# Patient Record
Sex: Male | Born: 1955 | Race: White | Hispanic: No | Marital: Married | Smoking: Never smoker
Health system: Southern US, Community
[De-identification: ages and names within clinical notes are randomized; demographics above are authoritative.]

## PROBLEM LIST (undated history)

## (undated) DIAGNOSIS — R251 Tremor, unspecified: Secondary | ICD-10-CM

## (undated) HISTORY — PX: HAND SURGERY: SHX662

## (undated) HISTORY — PX: HERNIA REPAIR: SHX51

---

## 2010-12-12 ENCOUNTER — Emergency Department (HOSPITAL_COMMUNITY): Payer: BC Managed Care – PPO

## 2010-12-12 ENCOUNTER — Emergency Department (HOSPITAL_COMMUNITY)
Admission: EM | Admit: 2010-12-12 | Discharge: 2010-12-12 | Disposition: A | Discharge status: Home / Self Care | Payer: BC Managed Care – PPO | Attending: Emergency Medicine | Admitting: Emergency Medicine

## 2010-12-12 DIAGNOSIS — S61411A Laceration without foreign body of right hand, initial encounter: Secondary | ICD-10-CM

## 2010-12-12 DIAGNOSIS — S61409A Unspecified open wound of unspecified hand, initial encounter: Secondary | ICD-10-CM | POA: Insufficient documentation

## 2010-12-12 DIAGNOSIS — Y9289 Other specified places as the place of occurrence of the external cause: Secondary | ICD-10-CM | POA: Insufficient documentation

## 2010-12-12 DIAGNOSIS — W268XXA Contact with other sharp object(s), not elsewhere classified, initial encounter: Secondary | ICD-10-CM | POA: Insufficient documentation

## 2010-12-12 MED ORDER — IBUPROFEN 800 MG PO TABS
800.0000 mg | ORAL_TABLET | Freq: Once | ORAL | Status: AC
  Administered 2010-12-12: 800 mg via ORAL
  Filled 2010-12-12: qty 1

## 2010-12-12 MED ORDER — CEPHALEXIN 500 MG PO CAPS: 500.0000 mg | ORAL_CAPSULE | Freq: Four times a day (QID) | ORAL | Status: AC

## 2010-12-12 MED ORDER — HYDROCODONE-ACETAMINOPHEN 5-325 MG PO TABS: ORAL_TABLET | ORAL | Status: DC

## 2010-12-12 MED ORDER — CEPHALEXIN 500 MG PO CAPS
500.0000 mg | ORAL_CAPSULE | Freq: Once | ORAL | Status: AC
  Administered 2010-12-12: 500 mg via ORAL

## 2010-12-12 MED ORDER — LIDOCAINE HCL (PF) 1 % IJ SOLN
INTRAMUSCULAR | Status: AC
  Administered 2010-12-12: 19:00:00
  Filled 2010-12-12: qty 5

## 2010-12-12 MED ORDER — BACITRACIN ZINC 500 UNIT/GM EX OINT
TOPICAL_OINTMENT | CUTANEOUS | Status: AC
  Administered 2010-12-12: 19:00:00
  Filled 2010-12-12: qty 0.9

## 2010-12-12 MED ORDER — CEPHALEXIN 500 MG PO CAPS
ORAL_CAPSULE | ORAL | Status: AC
  Administered 2010-12-12: 500 mg
  Filled 2010-12-12: qty 1

## 2010-12-12 MED ORDER — LIDOCAINE HCL (PF) 2 % IJ SOLN
INTRAMUSCULAR | Status: AC
  Administered 2010-12-12: 19:00:00
  Filled 2010-12-12: qty 10

## 2010-12-12 MED ORDER — OXYCODONE-ACETAMINOPHEN 5-325 MG PO TABS
1.0000 | ORAL_TABLET | Freq: Once | ORAL | Status: AC
  Administered 2010-12-12: 1 via ORAL
  Filled 2010-12-12: qty 1

## 2010-12-12 NOTE — ED Notes (Signed)
Pt presents with right hand laceration. Bleeding is controlled at this time.

## 2010-12-12 NOTE — ED Provider Notes (Signed)
History     CSN: [id number] Arrival date & time: 12/12/2010  4:51 PM   First MD Initiated Contact with Patient 12/12/10 1718      Chief Complaint  Patient presents with  . Laceration    (Consider location/radiation/quality/duration/timing/severity/associated sxs/prior treatment) HPI Comments: Pt is building  A barn.  He was falling off a stool and grabbed a rough cut ceiling joist and lacerated his R hand.  + L hand dominant.  Patient is a 55 y.o. male presenting with skin laceration. No language interpreter was used.  Laceration  The incident occurred 1 to 2 hours ago. The laceration is located on the right hand. The pain has been intermittent since onset. His tetanus status is UTD.    History reviewed. No pertinent past medical history.  Past Surgical History  Procedure Date  . Hernia repair   . Hand surgery     No family history on file.  History  Substance Use Topics  . Smoking status: Never Smoker   . Smokeless tobacco: Not on file  . Alcohol Use: No      Review of Systems  Skin: Positive for wound.  All other systems reviewed and are negative.    Allergies  Review of patient's allergies indicates no known allergies.  Home Medications   Current Outpatient Rx  Name Route Sig Dispense Refill  . ZOLPIDEM TARTRATE 10 MG PO TABS Oral Take 3.25 mg by mouth at bedtime.        BP 133/87  Pulse 104  Temp(Src) 98.4 F (36.9 C) (Oral)  Resp 18  Ht 5\' 10"  (1.778 m)  Wt 165 lb (74.844 kg)  BMI 23.68 kg/m2  SpO2 98%  Physical Exam  Nursing note and vitals reviewed. Constitutional: He is oriented to person, place, and time. He appears well-developed and well-nourished. No distress.  HENT:  Head: Normocephalic and atraumatic.  Eyes: EOM are normal.  Neck: Normal range of motion.  Cardiovascular: Normal rate, regular rhythm, normal heart sounds and intact distal pulses.   Pulmonary/Chest: Effort normal and breath sounds normal. No respiratory distress.    Abdominal: Soft. He exhibits no distension. There is no tenderness.  Musculoskeletal: He exhibits tenderness.       Right hand: He exhibits decreased range of motion, tenderness, bony tenderness and laceration. He exhibits normal two-point discrimination, normal capillary refill and no deformity. normal sensation noted.       Hands: Neurological: He is alert and oriented to person, place, and time.  Skin: Skin is warm and dry. He is not diaphoretic.  Psychiatric: He has a normal mood and affect. Judgment normal.    ED Course  LACERATION REPAIR Date/Time: 12/12/2010 6:25 PM Performed by: Worthy Rancher Authorized by: Worthy Rancher Consent: Verbal consent obtained. Written consent not obtained. Risks and benefits: risks, benefits and alternatives were discussed Consent given by: patient Patient understanding: patient states understanding of the procedure being performed Required items: required blood products, implants, devices, and special equipment available Patient identity confirmed: verbally with patient Time out: Immediately prior to procedure a "time out" was called to verify the correct patient, procedure, equipment, support staff and site/side marked as required. Body area: upper extremity Location details: right hand Laceration length: 3 cm Foreign bodies: no foreign bodies Tendon involvement: none Nerve involvement: none Vascular damage: no Anesthesia: digital block Local anesthetic: lidocaine 2% without epinephrine Anesthetic total: 8 ml Patient sedated: no Preparation: Patient was prepped and draped in the usual sterile fashion. Irrigation solution: saline  Irrigation method: syringe Amount of cleaning: extensive Debridement: minimal Degree of undermining: none Skin closure: 4-0 nylon Number of sutures: 4 Patient tolerance: Patient tolerated the procedure well with no immediate complications.   (including critical care time)  Labs Reviewed - No data to  display No results found.   No diagnosis found.    MDM          Worthy Rancher, PA 12/12/10 1902

## 2010-12-13 NOTE — ED Provider Notes (Signed)
Medical screening examination/treatment/procedure(s) were performed by non-physician practitioner and as supervising physician I was immediately available for consultation/collaboration. Devoria Albe, MD, [facility]   Ward Givens, MD 12/13/10 [phone]

## 2012-04-15 IMAGING — CR DG HAND COMPLETE 3+V*R*
3 series · 3 of 3 positions shown · non-contrast
Comparison: None.

CLINICAL DATA: Trauma/fall, laceration between the thumb and index
finger, prior tendon injury to fifth digit

RIGHT HAND - COMPLETE 3+ VIEW

[view not recorded (1 of 3)]
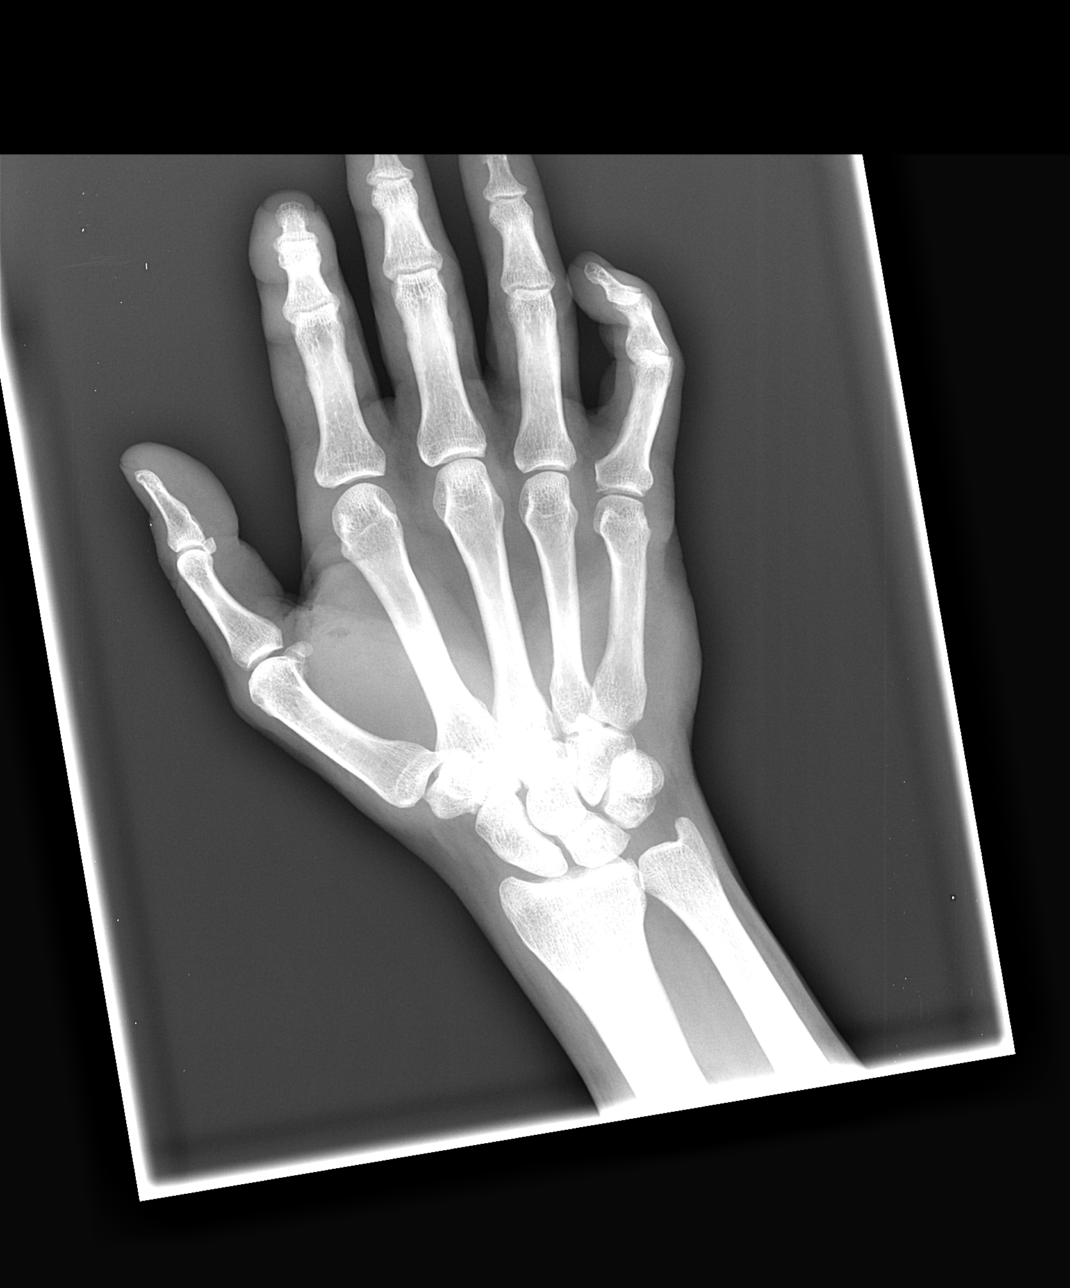

[view not recorded (2 of 3)]
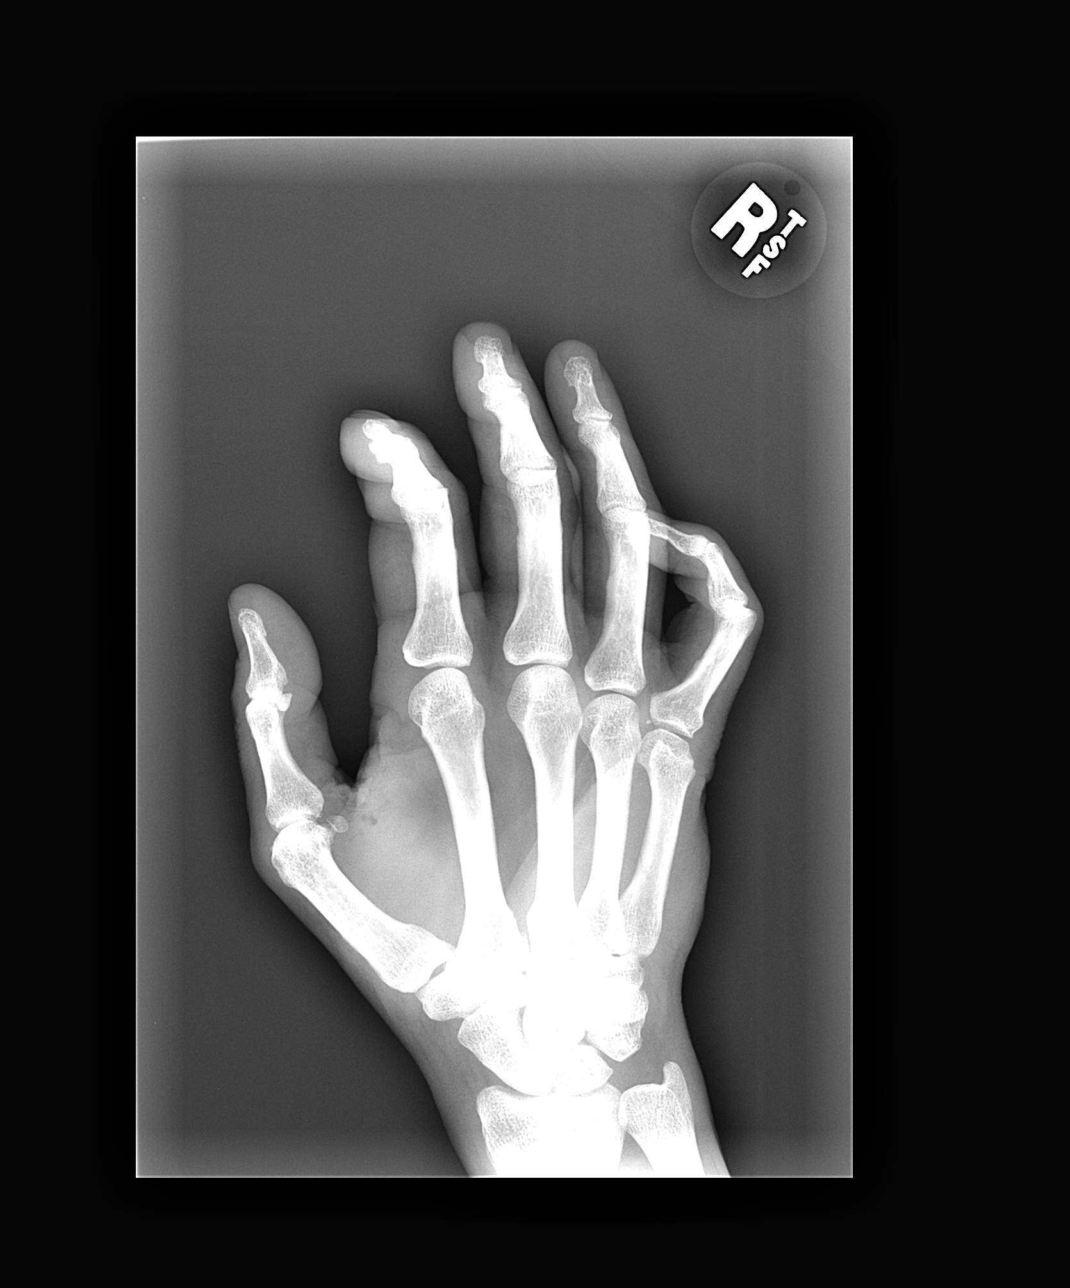

[view not recorded (3 of 3)]
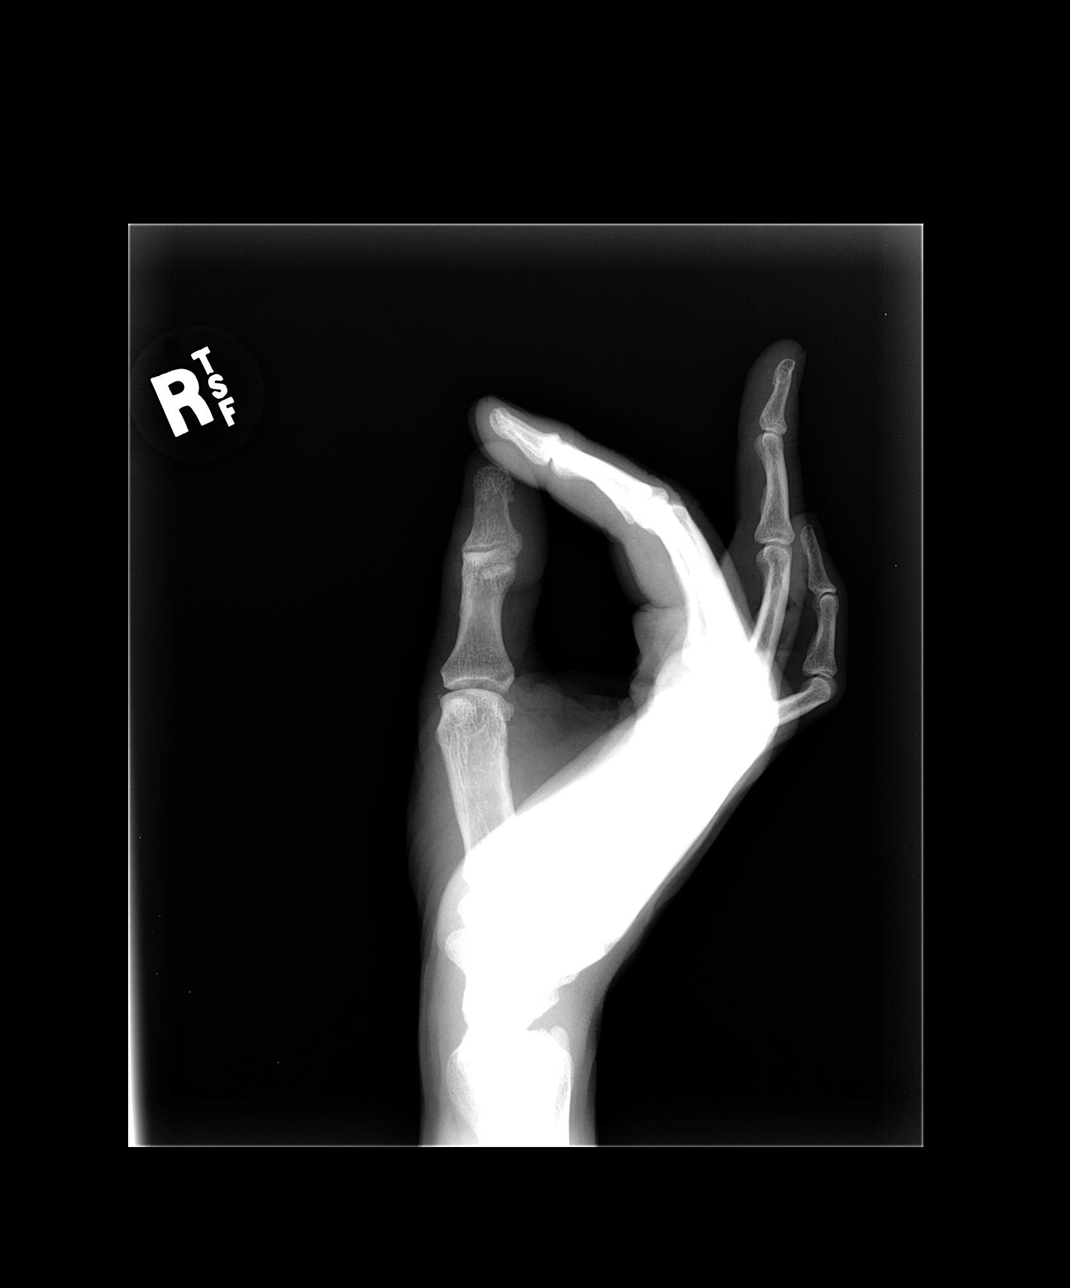

[3 of 3 positions shown; findings below may reference images not displayed]

FINDINGS: No fracture or dislocation is seen.

The joint spaces are preserved.

Soft tissue injury/laceration involving the left first/second digit
interspace.  No radiopaque foreign body is seen.
IMPRESSION: No fracture, dislocation, or radiopaque foreign body is seen.

Soft tissue injury/laceration involving the left first/second digit
interspace.

## 2016-06-30 ENCOUNTER — Encounter: Payer: Self-pay | Admitting: Orthopedic Surgery

## 2016-06-30 ENCOUNTER — Ambulatory Visit (INDEPENDENT_AMBULATORY_CARE_PROVIDER_SITE_OTHER): Payer: 59 | Admitting: Orthopedic Surgery

## 2016-06-30 VITALS — BP 122/70 | HR 81 | Ht 70.0 in | Wt 161.0 lb

## 2016-06-30 DIAGNOSIS — G5602 Carpal tunnel syndrome, left upper limb: Secondary | ICD-10-CM | POA: Diagnosis not present

## 2016-06-30 NOTE — Progress Notes (Signed)
  NEW PATIENT OFFICE VISIT    Chief Complaint  Patient presents with  . Carpal Tunnel    LT    61 year old male retired from the [employer] began having numbness and tingling in his left hand for years ago. It involved his thumb index and long finger and was associated with driving and was intermittent. 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 He is left-hand dominant  Bringing us forward in July he started having tingling 100% of the time in the thumb index and long finger was worse when he is writing doesn't hurt him or bother many other time he has not had any medicine or bracing    Review of Systems  Constitutional: Negative for chills and fever.  Respiratory: Negative for shortness of breath.   Cardiovascular: Negative for chest pain.  Neurological: Positive for tingling. Negative for focal weakness.    The patient has no hypertension diabetes or cardiac disease   Past Surgical History:  Procedure Laterality Date  . HAND SURGERY    . HERNIA REPAIR      No family history on file. Social History  Substance Use Topics  . Smoking status: Never Smoker  . Smokeless tobacco: Never Used  . Alcohol use No    BP 122/70   Pulse 81   Ht 5\' 10"  (1.778 m)   Wt 161 lb (73 kg)   BMI 23.10 kg/m   Physical Exam  Constitutional: He appears well-developed and well-nourished.  Vital signs have been reviewed and are stable. Gen. appearance the patient is well-developed and well-nourished with normal grooming and hygiene. The patient is oriented 3 with normal mood and affect.  Vitals reviewed.   Ortho Exam On the left side we find tenderness over the carpal tunnel with a positive compression test stability and range of motion was normal motor was normal pulses normal sensation was altered in the thumb index and long finger with ring and small finger normal skin was intact  On the right side he had normal range of  motion stability strength sensation pulse skin No orders of the defined types were placed in this encounter.   Encounter Diagnosis  Name Primary?  . Left carpal tunnel syndrome Yes     PLAN:   Patient will have vitamin B 600 mg twice a day for 6 weeks night splint for 6 weeks come back in 6 weeks

## 2016-06-30 NOTE — Patient Instructions (Addendum)
Take vitamin B6 twice a day for 6 weeks   Carpal Tunnel Syndrome Carpal tunnel syndrome is a condition that causes pain in your hand and arm. The carpal tunnel is a narrow area located on the palm side of your wrist. Repeated wrist motion or certain diseases may cause swelling within the tunnel. This swelling pinches the main nerve in the wrist (median nerve). What are the causes? This condition may be caused by:  Repeated wrist motions.  Wrist injuries.  Arthritis.  A cyst or tumor in the carpal tunnel.  Fluid buildup during pregnancy.  Sometimes the cause of this condition is not known. What increases the risk? This condition is more likely to develop in:  People who have jobs that cause them to repeatedly move their wrists in the same motion, such as [employer] visitor[employer] and cashiers.  Women.  People with certain conditions, such as: ? Diabetes. ? Obesity. ? An underactive thyroid (hypothyroidism). ? Kidney failure.  What are the signs or symptoms? Symptoms of this condition include:  A tingling feeling in your fingers, especially in your thumb, index, and middle fingers.  Tingling or numbness in your hand.  An aching feeling in your entire arm, especially when your wrist and elbow are bent for long periods of time.  Wrist pain that goes up your arm to your shoulder.  Pain that goes down into your palm or fingers.  A weak feeling in your hands. You may have trouble grabbing and holding items.  Your symptoms may feel worse during the night. How is this diagnosed? This condition is diagnosed with a medical history and physical exam. You may also have tests, including:  An electromyogram (EMG). This test measures electrical signals sent by your nerves into the muscles.  X-rays.  How is this treated? Treatment for this condition includes:  Lifestyle changes. It is important to stop doing or modify the activity that caused your condition.  Physical or occupational  therapy.  Medicines for pain and inflammation. This may include medicine that is injected into your wrist.  A wrist splint.  Surgery.  Follow these instructions at home: If you have a splint:  Wear it as told by your health care provider. Remove it only as told by your health care provider.  Loosen the splint if your fingers become numb and tingle, or if they turn cold and blue.  Keep the splint clean and dry. General instructions  Take over-the-counter and prescription medicines only as told by your health care provider.  Rest your wrist from any activity that may be causing your pain. If your condition is work related, talk to your employer about changes that can be made, such as getting a wrist pad to use while typing.  If directed, apply ice to the painful area: ? Put ice in a plastic bag. ? Place a towel between your skin and the bag. ? Leave the ice on for 20 minutes, 2-3 times per day.  Keep all follow-up visits as told by your health care provider. This is important.  Do any exercises as told by your health care provider, physical therapist, or occupational therapist. Contact a health care provider if:  You have new symptoms.  Your pain is not controlled with medicines.  Your symptoms get worse. This information is not intended to replace advice given to you by your health care provider. Make sure you discuss any questions you have with your health care provider. Document Released: 01/10/2000 Document Revised: 05/23/2015 Document Reviewed: 05/30/2014  Elsevier Interactive Patient Education  2017 [employer]  

## 2016-08-11 ENCOUNTER — Encounter: Payer: Self-pay | Admitting: Orthopedic Surgery

## 2016-08-11 ENCOUNTER — Ambulatory Visit (INDEPENDENT_AMBULATORY_CARE_PROVIDER_SITE_OTHER): Payer: 59 | Admitting: Orthopedic Surgery

## 2016-08-11 DIAGNOSIS — G5602 Carpal tunnel syndrome, left upper limb: Secondary | ICD-10-CM

## 2016-08-11 NOTE — Progress Notes (Signed)
FOLLOW UP APPT  Chief Complaint  Patient presents with  . Follow-up    Recheck on left CTS.   61 year old male with long-standing left carpal tunnel syndrome has not improved with medication. He is interesting have a left carpal tunnel release  We reviewed the surgical procedure, and postoperative potential complications. The patient will call us back with the date that is convenient for him to have the surgery.  Review of Systems  Constitutional: Negative for chills and fever.  Respiratory: Negative for shortness of breath.   Cardiovascular: Negative for chest pain.  Neurological: Positive for tingling. Negative for focal weakness.     History reviewed. No pertinent past medical history. Past Surgical History:  Procedure Laterality Date  . HAND SURGERY    . HERNIA REPAIR     Social History  Substance Use Topics  . Smoking status: Never Smoker  . Smokeless tobacco: Never Used  . Alcohol use No   History reviewed. No pertinent family history.   Encounter Diagnoses  Name Primary?  . Left carpal tunnel syndrome Yes    OPEN LEFT CARPAL TUNNEL RELEASE

## 2016-08-18 ENCOUNTER — Telehealth: Payer: Self-pay | Admitting: Orthopedic Surgery

## 2016-08-18 NOTE — Telephone Encounter (Signed)
Mr. Dustin Watson called this morning with some dates for open left carpal tunnel surgery.  He said he would like to have surgery sometime the week of September 10th if possible.    Please let him know if this is possible  Thanks

## 2016-08-19 ENCOUNTER — Other Ambulatory Visit: Payer: Self-pay | Admitting: *Deleted

## 2016-08-19 NOTE — Telephone Encounter (Signed)
Surgery scheduled for 10/08/16

## 2016-08-31 ENCOUNTER — Telehealth: Payer: Self-pay | Admitting: Orthopedic Surgery

## 2016-08-31 NOTE — Telephone Encounter (Addendum)
Patient called this morning stating he had a couple of questions:  He wants to know how much the surgery was going to cost.  He wants to know if he can move the surgery out a week, as in September 16th.  Please give him a call.  Thanks    I SPOKE WITH  Dustin Watson AND THE 18TH WORKS FOR HIM FOR SURGERY.

## 2016-08-31 NOTE — Telephone Encounter (Signed)
I HAVE NO WAY OF KNOWING COST  AND WE HAVE SEPT 18TH AVAILABLE

## 2016-09-01 NOTE — Telephone Encounter (Signed)
PLEASE GO AHEAD AND SCHEDULE HIM FOR THE 18TH.  I HAVE CHANGED POST OP APPOINTMENT HERE IN THE OFFICE

## 2016-09-29 ENCOUNTER — Telehealth: Payer: Self-pay | Admitting: Orthopedic Surgery

## 2016-09-29 NOTE — Telephone Encounter (Signed)
Surgery has been moved to the 18th as requested. Call patient, no answer, left detailed vm.

## 2016-09-29 NOTE — Telephone Encounter (Signed)
Patient called to re-verify date of surgery - [facility] Left Carpal tunnel release, Date of surgery as 10/08/16.  Most recent notes indicate Date of surgery updated as discussed: 10/13/16.  Please advise. Patient [phone]#[phone].

## 2016-09-29 NOTE — Telephone Encounter (Signed)
Routing to [employer]he

## 2016-09-30 NOTE — Patient Instructions (Signed)
Dustin Watson  09/30/2016     @PREFPERIOPPHARMACY @   Your procedure is scheduled on  10/13/2016   Report to [facility] at  845  A.M.  Call this number if you have problems the morning of surgery:  [phone]   Remember:  Do not eat food or drink liquids after midnight.  Take these medicines the morning of surgery with A SIP OF WATER  Primidone.   Do not wear jewelry, make-up or nail polish.  Do not wear lotions, powders, or perfumes, or deoderant.  Do not shave 48 hours prior to surgery.  Men may shave face and neck.  Do not bring valuables to the hospital.  [facility] is not responsible for any belongings or valuables.  Contacts, dentures or bridgework may not be worn into surgery.  Leave your suitcase in the car.  After surgery it may be brought to your room.  For patients admitted to the hospital, discharge time will be determined by your treatment team.  Patients discharged the day of surgery will not be allowed to drive home.   Name and phone number of your driver:   Family Special instructions:  None  Please read over the following fact sheets that you were given. Anesthesia Post-op Instructions and Care and Recovery After Surgery       Carpal Tunnel Release Carpal tunnel release is a surgical procedure to relieve numbness and pain in your hand that are caused by carpal tunnel syndrome. Your carpal tunnel is a narrow, hollow space in your wrist. It passes between your wrist bones and a band of connective tissue (transverse carpal ligament). The nerve that supplies most of your hand (median nerve) passes through this space, and so do the connections between your fingers and the muscles of your arm (tendons). Carpal tunnel syndrome makes this space swell and become narrow, and this causes pain and numbness. In carpal tunnel release surgery, a surgeon cuts through the transverse carpal ligament to make more room in the carpal tunnel space. You may have  this surgery if other types of treatment have not worked. Tell a health care provider about:  Any allergies you have.  All medicines you are taking, including vitamins, herbs, eye drops, creams, and over-the-counter medicines.  Any problems you or family members have had with anesthetic medicines.  Any blood disorders you have.  Any surgeries you have had.  Any medical conditions you have. What are the risks? Generally, this is a safe procedure. However, problems may occur, including:  Bleeding.  Infection.  Injury to the median nerve.  Need for additional surgery.  What happens before the procedure?  Ask your health care provider about: ? Changing or stopping your regular medicines. This is especially important if you are taking diabetes medicines or blood thinners. ? Taking medicines such as aspirin and ibuprofen. These medicines can thin your blood. Do not take these medicines before your procedure if your health care provider instructs you not to.  Do not eat or drink anything after midnight on the night before the procedure or as directed by your health care provider.  Plan to have someone take you home after the procedure. What happens during the procedure?  An IV tube may be inserted into a vein.  You will be given one of the following: ? A medicine that numbs the wrist area (local anesthetic). You may also be given a medicine to make you relax (sedative). ?  A medicine that makes you go to sleep (general anesthetic).  Your arm, hand, and wrist will be cleaned with a germ-killing solution (antiseptic).  Your surgeon will make a surgical cut (incision) over the palm side of your wrist. The surgeon will pull aside the skin of your wrist to expose the carpal tunnel space.  The surgeon will cut the transverse carpal ligament.  The edges of the incision will be closed with stitches (sutures) or staples.  A bandage (dressing) will be placed over your wrist and  wrapped around your hand and wrist. What happens after the procedure?  You may spend some time in a recovery area.  Your blood pressure, heart rate, breathing rate, and blood oxygen level will be monitored often until the medicines you were given have worn off.  You will likely have some pain. You will be given pain medicine.  You may need to wear a splint or a wrist brace over your dressing. This information is not intended to replace advice given to you by your health care provider. Make sure you discuss any questions you have with your health care provider. Document Released: 04/04/2003 Document Revised: 06/20/2015 Document Reviewed: 08/30/2013 Elsevier Interactive Patient Education  2017 Else[employer] Inc. Carpal Tunnel Release, Care After Refer to this sheet in the next few weeks. These instructions provide you with information about caring for yourself after your procedure. Your health care provider may also give you more specific instructions. Your treatment has been planned according to current medical practices, but problems sometimes occur. Call your health care provider if you have any problems or questions after your procedure. What can I expect after the procedure? After your procedure, it is typical to have the following:  Pain.  Numbness.  Tingling.  Swelling.  Stiffness.  Bruising.  Follow these instructions at home:  Take medicines only as directed by your health care provider.  There are many different ways to close and cover an incision, including stitches (sutures), skin glue, and adhesive strips. Follow your health care provider's instructions about: ? Incision care. ? Bandage (dressing) changes and removal. ? Incision closure removal.  Wear a splint or a brace as directed by your surgeon. You may need to do this for 2-3 weeks.  Keep your hand raised (elevated) above the level of your heart while you are resting. Move your fingers often.  Avoid activities  that cause hand pain.  Ask your surgeon when you can start to do all of your usual activities again, such as: ? Driving. ? Returning to work. ? Bathing and swimming.  Keep all follow-up visits as directed by your health care provider. This is important. You may need physical therapy for several months to speed healing and regain movement. Contact a health care provider if:  You have drainage, redness, swelling, or pain at your incision site.  You have a fever.  You have chills.  Your pain medicine is not working.  Your symptoms do not go away after 2 months.  Your symptoms go away and then return. Get help right away if:  You have pain or numbness that is getting worse.  Your fingers change color.  You are not able to move your fingers. This information is not intended to replace advice given to you by your health care provider. Make sure you discuss any questions you have with your health care provider. Document Released: 08/01/2004 Document Revised: 06/20/2015 Document Reviewed: 08/30/2013 Elsevier Interactive Patient Education  2017 Elsevier Inc.  Monitored Anesthesia  Care Anesthesia is a term that refers to techniques, procedures, and medicines that help a person stay safe and comfortable during a medical procedure. Monitored anesthesia care, or sedation, is one type of anesthesia. Your anesthesia specialist may recommend sedation if you will be having a procedure that does not require you to be unconscious, such as:  Cataract surgery.  A dental procedure.  A biopsy.  A colonoscopy.  During the procedure, you may receive a medicine to help you relax (sedative). There are three levels of sedation:  Mild sedation. At this level, you may feel awake and relaxed. You will be able to follow directions.  Moderate sedation. At this level, you will be sleepy. You may not remember the procedure.  Deep sedation. At this level, you will be asleep. You will not remember the  procedure.  The more medicine you are given, the deeper your level of sedation will be. Depending on how you respond to the procedure, the anesthesia specialist may change your level of sedation or the type of anesthesia to fit your needs. An anesthesia specialist will monitor you closely during the procedure. Let your health care provider know about:  Any allergies you have.  All medicines you are taking, including vitamins, herbs, eye drops, creams, and over-the-counter medicines.  Any use of steroids (by mouth or as a cream).  Any problems you or family members have had with sedatives and anesthetic medicines.  Any blood disorders you have.  Any surgeries you have had.  Any medical conditions you have, such as sleep apnea.  Whether you are pregnant or may be pregnant.  Any use of cigarettes, alcohol, or street drugs. What are the risks? Generally, this is a safe procedure. However, problems may occur, including:  Getting too much medicine (oversedation).  Nausea.  Allergic reaction to medicines.  Trouble breathing. If this happens, a breathing tube may be used to help with breathing. It will be removed when you are awake and breathing on your own.  Heart trouble.  Lung trouble.  Before the procedure Staying hydrated Follow instructions from your health care provider about hydration, which may include:  Up to 2 hours before the procedure - you may continue to drink clear liquids, such as water, clear fruit juice, black coffee, and plain tea.  Eating and drinking restrictions Follow instructions from your health care provider about eating and drinking, which may include:  8 hours before the procedure - stop eating heavy meals or foods such as meat, fried foods, or fatty foods.  6 hours before the procedure - stop eating light meals or foods, such as toast or cereal.  6 hours before the procedure - stop drinking milk or drinks that contain milk.  2 hours before the  procedure - stop drinking clear liquids.  Medicines Ask your health care provider about:  Changing or stopping your regular medicines. This is especially important if you are taking diabetes medicines or blood thinners.  Taking medicines such as aspirin and ibuprofen. These medicines can thin your blood. Do not take these medicines before your procedure if your health care provider instructs you not to.  Tests and exams  You will have a physical exam.  You may have blood tests done to show: ? How well your kidneys and liver are working. ? How well your blood can clot.  General instructions  Plan to have someone take you home from the hospital or clinic.  If you will be going home right after the procedure,  plan to have someone with you for 24 hours.  What happens during the procedure?  Your blood pressure, heart rate, breathing, level of pain and overall condition will be monitored.  An IV tube will be inserted into one of your veins.  Your anesthesia specialist will give you medicines as needed to keep you comfortable during the procedure. This may mean changing the level of sedation.  The procedure will be performed. After the procedure  Your blood pressure, heart rate, breathing rate, and blood oxygen level will be monitored until the medicines you were given have worn off.  Do not drive for 24 hours if you received a sedative.  You may: ? Feel sleepy, clumsy, or nauseous. ? Feel forgetful about what happened after the procedure. ? Have a sore throat if you had a breathing tube during the procedure. ? Vomit. This information is not intended to replace advice given to you by your health care provider. Make sure you discuss any questions you have with your health care provider. Document Released: 10/08/2004 Document Revised: 06/21/2015 Document Reviewed: 05/05/2015 Elsevier Interactive Patient Education  2018 [place], Care After These  instructions provide you with information about caring for yourself after your procedure. Your health care provider may also give you more specific instructions. Your treatment has been planned according to current medical practices, but problems sometimes occur. Call your health care provider if you have any problems or questions after your procedure. What can I expect after the procedure? After your procedure, it is common to:  Feel sleepy for several hours.  Feel clumsy and have poor balance for several hours.  Feel forgetful about what happened after the procedure.  Have poor judgment for several hours.  Feel nauseous or vomit.  Have a sore throat if you had a breathing tube during the procedure.  Follow these instructions at home: For at least 24 hours after the procedure:   Do not: ? Participate in activities in which you could fall or become injured. ? Drive. ? Use heavy machinery. ? Drink alcohol. ? Take sleeping pills or medicines that cause drowsiness. ? Make important decisions or sign legal documents. ? Take care of children on your own.  Rest. Eating and drinking  Follow the diet that is recommended by your health care provider.  If you vomit, drink water, juice, or soup when you can drink without vomiting.  Make sure you have little or no nausea before eating solid foods. General instructions  Have a responsible adult stay with you until you are awake and alert.  Take over-the-counter and prescription medicines only as told by your health care provider.  If you smoke, do not smoke without supervision.  Keep all follow-up visits as told by your health care provider. This is important. Contact a health care provider if:  You keep feeling nauseous or you keep vomiting.  You feel light-headed.  You develop a rash.  You have a fever. Get help right away if:  You have trouble breathing. This information is not intended to replace advice given to you  by your health care provider. Make sure you discuss any questions you have with your health care provider. Document Released: 05/05/2015 Document Revised: 09/04/2015 Document Reviewed: 05/05/2015 Elsevier Interactive Patient Education  [employer].

## 2016-10-02 ENCOUNTER — Encounter (HOSPITAL_COMMUNITY)
Admission: RE | Admit: 2016-10-02 | Discharge: 2016-10-02 | Disposition: A | Discharge status: Home / Self Care | Payer: 59 | Source: Ambulatory Visit | Attending: Orthopedic Surgery | Admitting: Orthopedic Surgery

## 2016-10-02 ENCOUNTER — Encounter (HOSPITAL_COMMUNITY): Payer: Self-pay

## 2016-10-02 DIAGNOSIS — Z01818 Encounter for other preprocedural examination: Secondary | ICD-10-CM | POA: Insufficient documentation

## 2016-10-02 HISTORY — DX: Tremor, unspecified: R25.1

## 2016-10-02 LAB — CBC WITH DIFFERENTIAL/PLATELET
BASOS ABS: 0 10*3/uL (ref 0.0–0.1)
BASOS PCT: 1 %
EOS ABS: 0.2 10*3/uL (ref 0.0–0.7)
EOS PCT: 3 %
HCT: 45.8 % (ref 39.0–52.0)
Hemoglobin: 15.8 g/dL (ref 13.0–17.0)
Lymphocytes Relative: 25 %
Lymphs Abs: 1.5 10*3/uL (ref 0.7–4.0)
MCH: 31.8 pg (ref 26.0–34.0)
MCHC: 34.5 g/dL (ref 30.0–36.0)
MCV: 92.2 fL (ref 78.0–100.0)
Monocytes Absolute: 0.7 10*3/uL (ref 0.1–1.0)
Monocytes Relative: 11 %
Neutro Abs: 3.8 10*3/uL (ref 1.7–7.7)
Neutrophils Relative %: 62 %
PLATELETS: 195 10*3/uL (ref 150–400)
RBC: 4.97 MIL/uL (ref 4.22–5.81)
RDW: 12.1 % (ref 11.5–15.5)
WBC: 6.1 10*3/uL (ref 4.0–10.5)

## 2016-10-02 LAB — BASIC METABOLIC PANEL
ANION GAP: 8 (ref 5–15)
BUN: 25 mg/dL — ABNORMAL HIGH (ref 6–20)
CALCIUM: 9.1 mg/dL (ref 8.9–10.3)
CO2: 25 mmol/L (ref 22–32)
Chloride: 102 mmol/L (ref 101–111)
Creatinine, Ser: 1.18 mg/dL (ref 0.61–1.24)
GFR calc Af Amer: >60 mL/min (ref >60)
Glucose, Bld: 99 mg/dL (ref 65–99)
POTASSIUM: 3.8 mmol/L (ref 3.5–5.1)
SODIUM: 135 mmol/L (ref 135–145)

## 2016-10-07 ENCOUNTER — Encounter: Payer: Self-pay | Admitting: *Deleted

## 2016-10-12 ENCOUNTER — Ambulatory Visit: Payer: 59 | Admitting: Orthopedic Surgery

## 2016-10-12 NOTE — H&P (Signed)
  Chief Complaint  Patient presents with  . Follow-up      Recheck on left CTS.    61 year old male with long-standing left carpal tunnel syndrome Was treated with medication he wanted to have carpal tunnel release he presents for surgery. He complained of pain paresthesias weakness tightness in his left upper extremity for several years  We reviewed the surgical procedure, and postoperative potential complications. The patient will call us back with the date that is convenient for him to have the surgery.   Review of Systems  Constitutional: Negative for chills and fever.  Respiratory: Negative for shortness of breath.   Cardiovascular: Negative for chest pain.  Neurological: Positive for tingling. Negative for focal weakness.        History reviewed. No pertinent past medical history.      Past Surgical History:  Procedure Laterality Date  . HAND SURGERY      . HERNIA REPAIR            Social History  Substance Use Topics  . Smoking status: Never Smoker  . Smokeless tobacco: Never Used  . Alcohol use No    History reviewed. No pertinent family history.    Physical Exam  Constitutional: He appears well-developed and well-nourished.  Vital signs have been reviewed and are stable. Gen. appearance the patient is well-developed and well-nourished with normal grooming and hygiene. The patient is oriented 3 with normal mood and affect.  Vitals reviewed.     Ortho Exam On the left side we find tenderness over the carpal tunnel with a positive compression test stability and range of motion was normal motor was normal pulses normal sensation was altered in the thumb index and long finger with ring and small finger normal skin was intact   On the right side he had normal range of motion stability strength sensation pulse skin No orders of the defined types were placed in this encounter.          Encounter Diagnoses  Name Primary?  . Left carpal tunnel syndrome Yes       OPEN LEFT CARPAL TUNNEL RELEASE

## 2016-10-13 ENCOUNTER — Ambulatory Visit (HOSPITAL_COMMUNITY)
Admission: RE | Admit: 2016-10-13 | Discharge: 2016-10-13 | Disposition: A | Discharge status: Home / Self Care | Payer: 59 | Source: Ambulatory Visit | Attending: Orthopedic Surgery | Admitting: Orthopedic Surgery

## 2016-10-13 ENCOUNTER — Encounter (HOSPITAL_COMMUNITY): Payer: Self-pay | Admitting: *Deleted

## 2016-10-13 ENCOUNTER — Encounter (HOSPITAL_COMMUNITY)
Admission: RE | Disposition: A | Discharge status: Home / Self Care | Payer: Self-pay | Source: Ambulatory Visit | Attending: Orthopedic Surgery

## 2016-10-13 ENCOUNTER — Ambulatory Visit (HOSPITAL_COMMUNITY): Payer: 59 | Admitting: Anesthesiology

## 2016-10-13 DIAGNOSIS — G5602 Carpal tunnel syndrome, left upper limb: Secondary | ICD-10-CM | POA: Diagnosis not present

## 2016-10-13 HISTORY — PX: CARPAL TUNNEL RELEASE: SHX101

## 2016-10-13 SURGERY — CARPAL TUNNEL RELEASE
Anesthesia: Monitor Anesthesia Care | Laterality: Left

## 2016-10-13 MED ORDER — ACETAMINOPHEN-CODEINE #4 300-60 MG PO TABS: 1.0000 | ORAL_TABLET | ORAL | 1 refills | Status: DC | PRN

## 2016-10-13 MED ORDER — LIDOCAINE HCL (PF) 0.5 % IJ SOLN
INTRAMUSCULAR | Status: AC
  Filled 2016-10-13: qty 50

## 2016-10-13 MED ORDER — MIDAZOLAM HCL 2 MG/2ML IJ SOLN
1.0000 mg | INTRAMUSCULAR | Status: AC
  Administered 2016-10-13: 2 mg via INTRAVENOUS

## 2016-10-13 MED ORDER — PROPOFOL 10 MG/ML IV BOLUS
INTRAVENOUS | Status: AC
  Filled 2016-10-13: qty 20

## 2016-10-13 MED ORDER — LACTATED RINGERS IV SOLN
INTRAVENOUS | Status: DC
  Administered 2016-10-13: 08:00:00 via INTRAVENOUS

## 2016-10-13 MED ORDER — BUPIVACAINE HCL (PF) 0.5 % IJ SOLN
INTRAMUSCULAR | Status: DC | PRN
  Administered 2016-10-13: 10 mL

## 2016-10-13 MED ORDER — 0.9 % SODIUM CHLORIDE (POUR BTL) OPTIME
TOPICAL | Status: DC | PRN
Start: 2016-10-13 — End: 2016-10-13
  Administered 2016-10-13: 1000 mL

## 2016-10-13 MED ORDER — CHLORHEXIDINE GLUCONATE 4 % EX LIQD: 60.0000 mL | Freq: Once | CUTANEOUS | Status: DC

## 2016-10-13 MED ORDER — MIDAZOLAM HCL 2 MG/2ML IJ SOLN
INTRAMUSCULAR | Status: AC
Start: 2016-10-13 — End: ?
  Filled 2016-10-13: qty 2

## 2016-10-13 MED ORDER — PROPOFOL 500 MG/50ML IV EMUL
INTRAVENOUS | Status: DC | PRN
  Administered 2016-10-13: 50 ug/kg/min via INTRAVENOUS

## 2016-10-13 MED ORDER — FENTANYL CITRATE (PF) 100 MCG/2ML IJ SOLN
INTRAMUSCULAR | Status: AC
  Filled 2016-10-13: qty 2

## 2016-10-13 MED ORDER — ACETAMINOPHEN-CODEINE #3 300-30 MG PO TABS: 1.0000 | ORAL_TABLET | ORAL | 1 refills | Status: DC | PRN

## 2016-10-13 MED ORDER — MIDAZOLAM HCL 2 MG/2ML IJ SOLN
INTRAMUSCULAR | Status: AC
  Filled 2016-10-13: qty 2

## 2016-10-13 MED ORDER — FENTANYL CITRATE (PF) 100 MCG/2ML IJ SOLN
25.0000 ug | Freq: Once | INTRAMUSCULAR | Status: AC
  Administered 2016-10-13: 25 ug via INTRAVENOUS

## 2016-10-13 MED ORDER — BUPIVACAINE HCL (PF) 0.5 % IJ SOLN
INTRAMUSCULAR | Status: AC
  Filled 2016-10-13: qty 30

## 2016-10-13 MED ORDER — LIDOCAINE HCL (PF) 1 % IJ SOLN
INTRAMUSCULAR | Status: AC
  Filled 2016-10-13: qty 5

## 2016-10-13 MED ORDER — CEFAZOLIN SODIUM-DEXTROSE 2-4 GM/100ML-% IV SOLN
2.0000 g | INTRAVENOUS | Status: AC
  Administered 2016-10-13: 2 g via INTRAVENOUS
  Filled 2016-10-13: qty 100

## 2016-10-13 MED ORDER — PROPOFOL 10 MG/ML IV BOLUS
INTRAVENOUS | Status: DC | PRN
  Administered 2016-10-13: 10 mg via INTRAVENOUS

## 2016-10-13 MED ORDER — FENTANYL CITRATE (PF) 100 MCG/2ML IJ SOLN: 25.0000 ug | INTRAMUSCULAR | Status: DC | PRN

## 2016-10-13 SURGICAL SUPPLY — 37 items
BAG HAMPER (MISCELLANEOUS) ×2 IMPLANT
BANDAGE ELASTIC 3 LF NS (GAUZE/BANDAGES/DRESSINGS) ×2 IMPLANT
BANDAGE ESMARK 4X12 BL STRL LF (DISPOSABLE) ×1 IMPLANT
BLADE SURG 15 STRL LF DISP TIS (BLADE) ×1 IMPLANT
BLADE SURG 15 STRL SS (BLADE) ×1
BNDG COHESIVE 4X5 TAN STRL (GAUZE/BANDAGES/DRESSINGS) ×2 IMPLANT
BNDG ESMARK 4X12 BLUE STRL LF (DISPOSABLE) ×2
BNDG GAUZE ELAST 4 BULKY (GAUZE/BANDAGES/DRESSINGS) ×2 IMPLANT
CHLORAPREP W/TINT 26ML (MISCELLANEOUS) ×2 IMPLANT
CLOTH BEACON ORANGE TIMEOUT ST (SAFETY) ×2 IMPLANT
COVER LIGHT HANDLE STERIS (MISCELLANEOUS) ×4 IMPLANT
CUFF TOURNIQUET SINGLE 18IN (TOURNIQUET CUFF) ×2 IMPLANT
DECANTER SPIKE VIAL GLASS SM (MISCELLANEOUS) ×2 IMPLANT
DRAPE PROXIMA HALF (DRAPES) ×4 IMPLANT
DRSG XEROFORM 1X8 (GAUZE/BANDAGES/DRESSINGS) ×2 IMPLANT
ELECT NEEDLE TIP 2.8 STRL (NEEDLE) ×2 IMPLANT
ELECT REM PT RETURN 9FT ADLT (ELECTROSURGICAL) ×2
ELECTRODE REM PT RTRN 9FT ADLT (ELECTROSURGICAL) ×1 IMPLANT
GAUZE SPONGE 4X4 12PLY STRL (GAUZE/BANDAGES/DRESSINGS) ×2 IMPLANT
GLOVE BIOGEL PI IND STRL 7.0 (GLOVE) ×1 IMPLANT
GLOVE BIOGEL PI INDICATOR 7.0 (GLOVE) ×1
GLOVE SKINSENSE NS SZ8.0 LF (GLOVE) ×1
GLOVE SKINSENSE STRL SZ8.0 LF (GLOVE) ×1 IMPLANT
GLOVE SS N UNI LF 8.5 STRL (GLOVE) ×2 IMPLANT
GOWN STRL REUS W/ TWL LRG LVL3 (GOWN DISPOSABLE) ×1 IMPLANT
GOWN STRL REUS W/TWL LRG LVL3 (GOWN DISPOSABLE) ×1
GOWN STRL REUS W/TWL XL LVL3 (GOWN DISPOSABLE) ×2 IMPLANT
HAND ALUMI XLG (SOFTGOODS) ×2 IMPLANT
KIT ROOM TURNOVER APOR (KITS) ×2 IMPLANT
MANIFOLD NEPTUNE II (INSTRUMENTS) ×2 IMPLANT
NEEDLE HYPO 21X1.5 SAFETY (NEEDLE) ×2 IMPLANT
NS IRRIG 1000ML POUR BTL (IV SOLUTION) ×2 IMPLANT
PACK BASIC LIMB (CUSTOM PROCEDURE TRAY) ×2 IMPLANT
PAD ARMBOARD 7.5X6 YLW CONV (MISCELLANEOUS) ×2 IMPLANT
SET BASIN LINEN APH (SET/KITS/TRAYS/PACK) ×2 IMPLANT
SUT ETHILON 3 0 FSL (SUTURE) ×2 IMPLANT
SYR CONTROL 10ML LL (SYRINGE) ×2 IMPLANT

## 2016-10-13 NOTE — Interval H&P Note (Signed)
History and Physical Interval Note:  10/13/2016 7:52 AM  Dustin Watson  has presented today for surgery, with the diagnosis of left carpal tunnel syndrome  The various methods of treatment have been discussed with the patient and family. After consideration of risks, benefits and other options for treatment, the patient has consented to  Procedure(s): CARPAL TUNNEL RELEASE (Left) as a surgical intervention .  The patient's history has been reviewed, patient examined, no change in status, stable for surgery.  I have reviewed the patient's chart and labs.  Questions were answered to the patient's satisfaction.     Fuller Canada

## 2016-10-13 NOTE — H&P (Signed)
  Chief Complaint  Patient presents with  . Follow-up      Recheck on left CTS.    61 year old male with long-standing left carpal tunnel syndrome Was treated with medication he wanted to have carpal tunnel release he presents for surgery. He complained of pain paresthesias weakness tightness in his left upper extremity for several years  C/o pain paresthesias left hand ring index and thumb with fine motor task difficulty. We reviewed the surgical procedure, and postoperative potential complications. The patient will call us back with the date that is convenient for him to have the surgery.   Review of Systems  Constitutional: Negative for chills and fever.  Respiratory: Negative for shortness of breath.   Cardiovascular: Negative for chest pain.  Neurological: Positive for tingling. Negative for focal weakness.        History reviewed. No pertinent past medical history.      Past Surgical History:  Procedure Laterality Date  . HAND SURGERY      . HERNIA REPAIR            Social History  Substance Use Topics  . Smoking status: Never Smoker  . Smokeless tobacco: Never Used  . Alcohol use No    History reviewed. No pertinent family history.    Physical Exam  Constitutional: He appears well-developed and well-nourished.  Vital signs have been reviewed and are stable. Gen. appearance the patient is well-developed and well-nourished with normal grooming and hygiene. The patient is oriented 3 with normal mood and affect.  Vitals reviewed.     Ortho Exam On the left side we find tenderness over the carpal tunnel with a positive compression test stability and range of motion was normal motor was normal pulses normal sensation was altered in the thumb index and long finger with ring and small finger normal skin was intact   On the right side he had normal range of motion stability strength sensation pulse skin No orders of the defined types were placed in this encounter.          Encounter Diagnoses  Name Primary?  . Left carpal tunnel syndrome Yes      OPEN LEFT CARPAL TUNNEL RELEASE

## 2016-10-13 NOTE — Discharge Instructions (Signed)
Carpal Tunnel Release, Care After °Refer to this sheet in the next few weeks. These instructions provide you with information about caring for yourself after your procedure. Your health care provider may also give you more specific instructions. Your treatment has been planned according to current medical practices, but problems sometimes occur. Call your health care provider if you have any problems or questions after your procedure. °What can I expect after the procedure? °After your procedure, it is typical to have the following: °· Pain. °· Numbness. °· Tingling. °· Swelling. °· Stiffness. °· Bruising. ° °Follow these instructions at home: °· Take medicines only as directed by your health care provider. °· There are many different ways to close and cover an incision, including stitches (sutures), skin glue, and adhesive strips. Follow your health care provider's instructions about: °? Incision care. °? Bandage (dressing) changes and removal. °? Incision closure removal. °· Wear a splint or a brace as directed by your surgeon. You may need to do this for 2-3 weeks. °· Keep your hand raised (elevated) above the level of your heart while you are resting. Move your fingers often. °· Avoid activities that cause hand pain. °· Ask your surgeon when you can start to do all of your usual activities again, such as: °? Driving. °? Returning to work. °? Bathing and swimming. °· Keep all follow-up visits as directed by your health care provider. This is important. You may need physical therapy for several months to speed healing and regain movement. °Contact a health care provider if: °· You have drainage, redness, swelling, or pain at your incision site. °· You have a fever. °· You have chills. °· Your pain medicine is not working. °· Your symptoms do not go away after 2 months. °· Your symptoms go away and then return. °Get help right away if: °· You have pain or numbness that is getting worse. °· Your fingers change  color. °· You are not able to move your fingers. °This information is not intended to replace advice given to you by your health care provider. Make sure you discuss any questions you have with your health care provider. °Document Released: 08/01/2004 Document Revised: 06/20/2015 Document Reviewed: 08/30/2013 °Elsevier Interactive Patient Education © 2017 Elsevier Inc. ° °

## 2016-10-13 NOTE — Anesthesia Preprocedure Evaluation (Signed)
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) negative psych ROS   GI/Hepatic negative GI ROS, Neg liver ROS,   Endo/Other  negative endocrine ROS  Renal/GU negative Renal ROS     Musculoskeletal   Abdominal   Peds  Hematology   Anesthesia Other Findings   Reproductive/Obstetrics                             Anesthesia Physical Anesthesia Plan  ASA: II  Anesthesia Plan: MAC   Post-op Pain Management:    Induction: Intravenous  PONV Risk Score and Plan:   Airway Management Planned: Simple Face Mask  Additional Equipment:   Intra-op Plan:   Post-operative Plan:   Informed Consent: I have reviewed the patients History and Physical, chart, labs and discussed the procedure including the risks, benefits and alternatives for the proposed anesthesia with the patient or authorized representative who has indicated his/her understanding and acceptance.     Plan Discussed with:   Anesthesia Plan Comments:         Anesthesia Quick Evaluation

## 2016-10-13 NOTE — Op Note (Signed)
Carpal tunnel release left wrist  Preop diagnosis carpal tunnel syndrome left wrist   postop diagnosis same  Procedure open carpal tunnel release left wrist [id number]  Surgeon Romeo Apple  Anesthesia regional Bier block  Operative findings compression of the left median nerve without any  carpal tunnel masses   Indications failure of conservative treatment to relieve pain and paresthesias and numbness and tingling of the left hand  The patient was identified in the preop area we confirm the surgical site marked as left wrist. Chart update completed. Patient taken to surgery. he had 2 g of Ancef. After establishing a Bier block left  arm was prepped with ChloraPrep.  Timeout executed completed and confirmed site.  A straight incision was made over the left carpal tunnel in line with the radial border of the ring finger. Blunt dissection was carried out to find the distal aspect of the carpal tunnel. A blunted instrument was passed beneath the carpal tunnel. Sharp incision was then used to release the transverse carpal ligament. The contents of the carpal tunnel were inspected. The median nerve was compressed with slight discoloration.  The wound was irrigated and then closed with 3-0 nylon suture. We injected 10 mL of plain Marcaine on the radial side of the incision  A sterile bandage was applied and the tourniquet was released the color of the hand and capillary refill were normal  The patient was taken to the recovery room in stable condition

## 2016-10-13 NOTE — Telephone Encounter (Signed)
10/07/2016  Documentation  MRN:  [id number]  Description: Male DOB: 1955-05-18 Provider: Diamantina Monks, LPN Department: [facility]   Reason for Visit   SURGICAL PRE Elwin Sleight [id number]  Reason for Visit History

## 2016-10-13 NOTE — Transfer of Care (Signed)
Immediate Anesthesia Transfer of Care Note  Patient: Dustin Watson  Procedure(s) Performed: Procedure(s): CARPAL TUNNEL RELEASE (Left)  Patient Location: PACU  Anesthesia Type:MAC  Level of Consciousness: awake and alert   Airway & Oxygen Therapy: Patient Spontanous Breathing  Post-op Assessment: Report given to RN  Post vital signs: Reviewed and stable  Last Vitals:  Vitals:   10/13/16 0733  BP: 122/83  Pulse: 64  Resp: 16  Temp: 36.6 C  SpO2: 97%    Last Pain:  Vitals:   10/13/16 0733  TempSrc: Oral      Patients Stated Pain Goal: 4 (25/36/64 4034)  Complications: No apparent anesthesia complications

## 2016-10-13 NOTE — Anesthesia Postprocedure Evaluation (Signed)
Anesthesia Post Note  Patient: Dustin Watson  Procedure(s) Performed: Procedure(s) (LRB): LEFT CARPAL TUNNEL RELEASE (Left)  Patient location during evaluation: Short Stay Anesthesia Type: MAC Level of consciousness: awake and alert and oriented Pain management: pain level controlled Vital Signs Assessment: post-procedure vital signs reviewed and stable Respiratory status: spontaneous breathing Cardiovascular status: blood pressure returned to baseline Postop Assessment: no apparent nausea or vomiting Anesthetic complications: no     Last Vitals:  Vitals:   10/13/16 0914 10/13/16 0920  BP: (!) 137/95 137/90  Pulse:  68  Resp: 12 12  Temp:  36.6 C  SpO2:  95%    Last Pain:  Vitals:   10/13/16 0859  TempSrc:   PainSc: 0-No pain                 Shamyia Grandpre

## 2016-10-13 NOTE — Brief Op Note (Signed)
10/13/2016  8:47 AM  PATIENT:  Dustin Watson  61 y.o. male  PRE-OPERATIVE DIAGNOSIS:  left carpal tunnel syndrome  POST-OPERATIVE DIAGNOSIS:  Same  PROCEDURE:  Procedure(s): CARPAL TUNNEL RELEASE (Left)   64721  Surgical findings compression median nerve color slightly dull no space-occupying lesions   SURGEON:  Surgeon(s) and Role:    Vickki Hearing, MD - Primary  PHYSICIAN ASSISTANT:   ASSISTANTS: none   ANESTHESIA:   regional  EBL:  Total I/O In: 500 [I.V.:500] Out: 0   BLOOD ADMINISTERED:none  DRAINS: none   LOCAL MEDICATIONS USED:  MARCAINE    and Amount: 10 ml  SPECIMEN:  No Specimen  DISPOSITION OF SPECIMEN:  N/A  COUNTS:  YES  TOURNIQUET:   Total Tourniquet Time Documented: Upper Arm (Left) - 29 minutes Total: Upper Arm (Left) - 29 minutes   DICTATION: .Reubin Milan Dictation  PLAN OF CARE: Discharge to home after PACU  PATIENT DISPOSITION:  PACU - hemodynamically stable.   Delay start of Pharmacological VTE agent (>24hrs) due to surgical blood loss or risk of bleeding: not applicable

## 2016-10-14 ENCOUNTER — Encounter (HOSPITAL_COMMUNITY): Payer: Self-pay | Admitting: Orthopedic Surgery

## 2016-10-15 ENCOUNTER — Telehealth: Payer: Self-pay | Admitting: Orthopedic Surgery

## 2016-10-15 NOTE — Telephone Encounter (Signed)
Dustin Watson called stating that he had Left CT done on Tuesday, 10-13-16.  He said he has been icing his wrist everyday but wants to know when can he stop doing this.  Please call and advise him.  Thanks

## 2016-10-15 NOTE — Telephone Encounter (Signed)
Patient instructed if not having a lot of swelling, he can back down on icing.

## 2016-10-16 ENCOUNTER — Ambulatory Visit: Payer: 59 | Admitting: Orthopedic Surgery

## 2016-10-19 ENCOUNTER — Telehealth: Payer: Self-pay | Admitting: *Deleted

## 2016-10-19 NOTE — Telephone Encounter (Signed)
Patient called requesting to speak with the nurse that is assisting Dr Romeo Apple today. Patient requested for a return call by 3:00 today. [phone]

## 2016-10-19 NOTE — Telephone Encounter (Signed)
Patient called concerned about MRSA exposure.  Patient currently has a dog with a wound they are treating and has been told the wound is MRSA.  Advised the patient to use Dial antibacterial soap and to keep his wound clear of the dog wound.  Ok per Dr Romeo Apple.

## 2016-10-21 ENCOUNTER — Ambulatory Visit (INDEPENDENT_AMBULATORY_CARE_PROVIDER_SITE_OTHER): Payer: 59 | Admitting: Orthopedic Surgery

## 2016-10-21 ENCOUNTER — Encounter: Payer: Self-pay | Admitting: Orthopedic Surgery

## 2016-10-21 VITALS — BP 113/76 | HR 85 | Ht 70.0 in | Wt 159.0 lb

## 2016-10-21 DIAGNOSIS — Z9889 Other specified postprocedural states: Secondary | ICD-10-CM

## 2016-10-21 DIAGNOSIS — Z4889 Encounter for other specified surgical aftercare: Secondary | ICD-10-CM

## 2016-10-21 NOTE — Progress Notes (Signed)
Dustin Watson is status post left carpal tunnel release   S:   Chief Complaint  Patient presents with  . Follow-up    post op DOS 10-13-16 --LT CTR   Doing well.   The patient complains of  Soreness, still having some numbness in his fingertips  Current pain medication: None  The incision is clean dry and intact with no drainage The dressing was changed.  Patient to return for suture removal ~ POD 12-14 Encounter Diagnoses  Name Primary?  Marland Kitchen Aftercare following surgery Yes  . History of carpal tunnel surgery of left wrist

## 2016-10-28 ENCOUNTER — Ambulatory Visit (INDEPENDENT_AMBULATORY_CARE_PROVIDER_SITE_OTHER): Payer: 59 | Admitting: Orthopedic Surgery

## 2016-10-28 ENCOUNTER — Encounter: Payer: Self-pay | Admitting: Orthopedic Surgery

## 2016-10-28 VITALS — BP 111/71 | HR 73 | Ht 70.0 in | Wt 159.0 lb

## 2016-10-28 DIAGNOSIS — Z4889 Encounter for other specified surgical aftercare: Secondary | ICD-10-CM

## 2016-10-28 DIAGNOSIS — Z9889 Other specified postprocedural states: Secondary | ICD-10-CM

## 2016-10-28 NOTE — Progress Notes (Signed)
Chief Complaint  Patient presents with  . Post-op Follow-up    10/13/16 left Carpal Tunnel release 2 weeks s/p    Carpal tunnel release doing well still has numbness in his fingers. No pain at this time wound looks clean all the sutures were taken out  Come back in 6 weeks  Encounter Diagnoses  Name Primary?  Marland Kitchen Aftercare following surgery Yes  . History of carpal tunnel surgery of left wrist

## 2016-11-04 ENCOUNTER — Ambulatory Visit: Payer: 59 | Admitting: Orthopedic Surgery

## 2016-12-08 DIAGNOSIS — Z9889 Other specified postprocedural states: Secondary | ICD-10-CM | POA: Insufficient documentation

## 2016-12-09 ENCOUNTER — Ambulatory Visit (INDEPENDENT_AMBULATORY_CARE_PROVIDER_SITE_OTHER): Payer: 59 | Admitting: Orthopedic Surgery

## 2016-12-09 VITALS — BP 116/75 | HR 82 | Ht 70.0 in | Wt 163.0 lb

## 2016-12-09 DIAGNOSIS — Z9889 Other specified postprocedural states: Secondary | ICD-10-CM

## 2016-12-09 NOTE — Progress Notes (Signed)
Chief Complaint  Patient presents with  . Routine Post Op    carpal tunnel release DOS 10/13/16   Encounter Diagnosis  Name Primary?  . History of carpal tunnel surgery left 10/13/16 Yes    The patient comes in for his 5525-month follow-up he is doing well no complaints he does have some sensitivity over the wound but he says it does not bother him he regained grip normal nighttime pain-free function and no numbness or tingling  We discharged him today

## 2017-08-21 DIAGNOSIS — G4700 Insomnia, unspecified: Secondary | ICD-10-CM | POA: Insufficient documentation

## 2017-08-21 DIAGNOSIS — G250 Essential tremor: Secondary | ICD-10-CM | POA: Insufficient documentation

## 2017-08-21 DIAGNOSIS — L309 Dermatitis, unspecified: Secondary | ICD-10-CM | POA: Insufficient documentation

## 2018-09-27 DIAGNOSIS — R931 Abnormal findings on diagnostic imaging of heart and coronary circulation: Secondary | ICD-10-CM | POA: Insufficient documentation

## 2018-09-27 DIAGNOSIS — E785 Hyperlipidemia, unspecified: Secondary | ICD-10-CM | POA: Insufficient documentation

## 2018-12-29 DIAGNOSIS — K4091 Unilateral inguinal hernia, without obstruction or gangrene, recurrent: Secondary | ICD-10-CM | POA: Insufficient documentation

## 2021-07-08 ENCOUNTER — Encounter: Payer: Self-pay | Admitting: Orthopedic Surgery

## 2021-07-08 ENCOUNTER — Ambulatory Visit (INDEPENDENT_AMBULATORY_CARE_PROVIDER_SITE_OTHER): Payer: Medicare Other

## 2021-07-08 ENCOUNTER — Ambulatory Visit (INDEPENDENT_AMBULATORY_CARE_PROVIDER_SITE_OTHER): Payer: Medicare Other | Admitting: Orthopedic Surgery

## 2021-07-08 VITALS — BP 124/79 | HR 82 | Ht 70.0 in | Wt 163.0 lb

## 2021-07-08 DIAGNOSIS — M25512 Pain in left shoulder: Secondary | ICD-10-CM | POA: Diagnosis not present

## 2021-07-08 DIAGNOSIS — G8929 Other chronic pain: Secondary | ICD-10-CM | POA: Diagnosis not present

## 2021-07-08 NOTE — Patient Instructions (Signed)
We have discussed taking medications for your ongoing pain ibuprofen or naproxen.  These are both NSAIDs (nonsteroidal anti-inflammatory drugs) and should not be taken together.  In clinic, we discussed taking these medications on a consistent basis for 7-14 days, regardless of how you feel.  This helps to build up the medication in your system, and work to minimize the inflammation.  After this period of time, I recommend going back to taking this medication as needed.  While taking this medication, if you have any side effects, including, but not limited to issues with your stomach, I would recommend stopping this medication.  If you have any questions, please do not hesitate to contact the clinic for clarification.    Rotator Cuff Tear/Tendinitis Rehab   Ask your health care provider which exercises are safe for you. Do exercises exactly as told by your health care provider and adjust them as directed. It is normal to feel mild stretching, pulling, tightness, or discomfort as you do these exercises. Stop right away if you feel sudden pain or your pain gets worse. Do not begin these exercises until told by your health care provider. Stretching and range-of-motion exercises  These exercises warm up your muscles and joints and improve the movement and flexibility of your shoulder. These exercises also help to relieve pain.  Shoulder pendulum In this exercise, you let the injured arm dangle toward the floor and then swing it like a clock pendulum. Stand near a table or counter that you can hold onto for balance. Bend forward at the waist and let your left / right arm hang straight down. Use your other arm to support you and help you stay balanced. Relax your left / right arm and shoulder muscles, and move your hips and your trunk so your left / right arm swings freely. Your arm should swing because of the motion of your body, not because you are using your arm or shoulder muscles. Keep moving your  hips and trunk so your arm swings in the following directions, as told by your health care provider: Side to side. Forward and backward. In clockwise and counterclockwise circles. Slowly return to the starting position. Repeat 10 times, or for 10 seconds per direction. Complete this exercise 2-3 times a day.      Shoulder flexion, seated This exercise is sometimes called table slides. In this exercise, you raise your arm in front of your body until you feel a stretch in your injured shoulder. Sit in a stable chair so your left / right forearm can rest on a flat surface. Your elbow should rest at a height that keeps your upper arm next to your body. Keeping your left / right shoulder relaxed, lean forward at the waist and let your hand slide forward (flexion). Stop when you feel a stretch in your shoulder, or when you reach the angle that is recommended by your health care provider. Hold for 5 seconds. Slowly return to the starting position. Repeat 10 times. Complete this exercise 1-2  times a day.       Shoulder flexion, standing In this exercise, you raise your arm in front of your body (flexion) until you feel a stretch in your injured shoulder. Stand and hold a broomstick, a cane, or a similar object. Place your hands a little more than shoulder-width apart on the object. Your left / right hand should be palm-up, and your other hand should be palm-down. Keep your elbow straight and your shoulder muscles relaxed. Push   the stick up with your healthy arm to raise your left / right arm in front of your body, and then over your head until you feel a stretch in your shoulder. Avoid shrugging your shoulder while you raise your arm. Keep your shoulder blade tucked down toward the middle of your back. Keep your left / right shoulder muscles relaxed. Hold for 10 seconds. Slowly return to the starting position. Repeat 10 times. Complete this exercise 1-2 times a day.      Shoulder abduction,  active-assisted You will need a stick, broom handle, or similar object to help you (assist) in doing this exercise. Lie on your back. This is the supine position. Hold a broomstick, a cane, or a similar object. Place your hands a little more than shoulder-width apart on the object. Your left / right hand should be palm-up, and your other hand should be palm-down. Keeping your shoulder relaxed, push the stick to raise your left / right arm out to your side (abduction) and then over your head. Use your other hand to help move the stick. Stop when you feel a stretch in your shoulder, or when you reach the angle that is recommended by your health care provider. Avoid shrugging your shoulder while you raise your arm. Keep your shoulder blade tucked down toward the middle of your back. Hold for 10 seconds. Slowly return to the starting position. Repeat 10 times. Complete this exercise 1-2 times a day.      Shoulder flexion, active-assisted Lie on your back. You may bend your knees for comfort. Hold a broomstick, a cane, or a similar object so that your hands are about shoulder-width apart. Your palms should face toward your feet. Raise your left / right arm over your head, then behind your head toward the floor (flexion). Use your other hand to help you do this (active-assisted). Stop when you feel a gentle stretch in your shoulder, or when you reach the angle that is recommended by your health care provider. Hold for 10 seconds. Use the stick and your other arm to help you return your left / right arm to the starting position. Repeat 10 times. Complete this exercise 1-2 times a day.      External rotation Sit in a stable chair without armrests, or stand up. Tuck a soft object, such as a folded towel or a small ball, under your left / right upper arm. Hold a broomstick, a cane, or a similar object with your palms face-down, toward the floor. Bend your elbows to a 90-degree angle (right angle), and  keep your hands about shoulder-width apart. Straighten your healthy arm and push the stick across your body, toward your left / right side. Keep your left / right arm bent. This will rotate your left / right forearm away from your body (external rotation). Hold for 10 seconds. Slowly return to the starting position. Repeat 10 times. Complete this exercise 1-2 times a day.        Strengthening exercises These exercises build strength and endurance in your shoulder. Endurance is the ability to use your muscles for a long time, even after they get tired. Do not start doing these exercises until your health care provider approves. Shoulder flexion, isometric Stand or sit in a doorway, facing the door frame. Keep your left / right arm straight and make a gentle fist with your hand. Place your fist against the door frame. Only your fist should be touching the frame. Keep your upper arm   at your side. Gently press your fist against the door frame, as if you are trying to raise your arm above your head (isometric shoulder flexion). Avoid shrugging your shoulder while you press your hand into the door frame. Keep your shoulder blade tucked down toward the middle of your back. Hold for 10 seconds. Slowly release the tension, and relax your muscles completely before you repeat the exercise. Repeat 10 times. Complete this exercise 3 times per week.      Shoulder abduction, isometric Stand or sit in a doorway. Your left / right arm should be closest to the door frame. Keep your left / right arm straight, and place the back of your hand against the door frame. Only your hand should be touching the frame. Keep the rest of your arm close to your side. Gently press the back of your hand against the door frame, as if you are trying to raise your arm out to the side (isometric shoulder abduction). Avoid shrugging your shoulder while you press your hand into the door frame. Keep your shoulder blade tucked down  toward the middle of your back. Hold for 10 seconds. Slowly release the tension, and relax your muscles completely before you repeat the exercise. Repeat 10 times. Complete this exercise 3 times per week.      Internal rotation, isometric This is an exercise in which you press your palm against a door frame without moving your shoulder joint (isometric). Stand or sit in a doorway, facing the door frame. Bend your left / right elbow, and place the palm of your hand against the door frame. Only your palm should be touching the frame. Keep your upper arm at your side. Gently press your hand against the door frame, as if you are trying to push your arm toward your abdomen (internal rotation). Gradually increase the pressure until you are pressing as hard as you can. Stop increasing the pressure if you feel shoulder pain. Avoid shrugging your shoulder while you press your hand into the door frame. Keep your shoulder blade tucked down toward the middle of your back. Hold for 10 seconds. Slowly release the tension, and relax your muscles completely before you repeat the exercise. Repeat 10 times. Complete this exercise 3 times per week.      External rotation, isometric This is an exercise in which you press the back of your wrist against a door frame without moving your shoulder joint (isometric). Stand or sit in a doorway, facing the door frame. Bend your left / right elbow and place the back of your wrist against the door frame. Only the back of your wrist should be touching the frame. Keep your upper arm at your side. Gently press your wrist against the door frame, as if you are trying to push your arm away from your abdomen (external rotation). Gradually increase the pressure until you are pressing as hard as you can. Stop increasing the pressure if you feel pain. Avoid shrugging your shoulder while you press your wrist into the door frame. Keep your shoulder blade tucked down toward the middle  of your back. Hold for 10 seconds. Slowly release the tension, and relax your muscles completely before you repeat the exercise. Repeat 10 times. Complete this exercise 3 times per week.       Scapular retraction Sit in a stable chair without armrests, or stand up. Secure an exercise band to a stable object in front of you so the band is at shoulder height.   Hold one end of the exercise band in each hand. Your palms should face down. Squeeze your shoulder blades together (retraction) and move your elbows slightly behind you. Do not shrug your shoulders upward while you do this. Hold for 10 seconds. Slowly return to the starting position. Repeat 10 times. Complete this exercise 3 times per week.      Shoulder extension Sit in a stable chair without armrests, or stand up. Secure an exercise band to a stable object in front of you so the band is above shoulder height. Hold one end of the exercise band in each hand. Straighten your elbows and lift your hands up to shoulder height. Squeeze your shoulder blades together and pull your hands down to the sides of your thighs (extension). Stop when your hands are straight down by your sides. Do not let your hands go behind your body. Hold for 10 seconds. Slowly return to the starting position. Repeat 10 times. Complete this exercise 3 times per week.       Scapular protraction, supine Lie on your back on a firm surface (supine position). Hold a 5 lbs (or soup can) weight in your left / right hand. Raise your left / right arm straight into the air so your hand is directly above your shoulder joint. Push the weight into the air so your shoulder (scapula) lifts off the surface that you are lying on. The scapula will push up or forward (protraction). Do not move your head, neck, or back. Hold for 10 seconds. Slowly return to the starting position. Let your muscles relax completely before you repeat this exercise.  Repeat 10 times. Complete this  exercise 3 times per week.          

## 2021-07-08 NOTE — Progress Notes (Signed)
New Patient Visit  Assessment: Dustin Watson is a 66 y.o. male with the following: 1. Chronic left shoulder pain  Plan: Dustin Watson has had pain in the left shoulder for a couple of months.  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  He has had progressive worsening pain in the posterior left shoulder.  No specific injury.  We discussed multiple treatment options, and he would like to proceed with medications as needed, and a home exercise program.  If his symptoms worsen, he should return to clinic.  At that time, we can discuss an injection versus formal physical therapy.  He states his understanding.  Follow-up: Return if symptoms worsen or fail to improve.  Subjective:  Chief Complaint  Patient presents with   Shoulder Pain    Left shoulder pain     History of Present Illness: Dustin Watson is a 66 y.o. LHD male who presents for evaluation of left shoulder pain.  He notes progressively worsening pain and deep within the posterior aspect of the left shoulder.  This has been ongoing for couple of months.  No specific injury.  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  He takes Tylenol and ibuprofen occasionally.  No therapy.  No prior injections.   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 No pertinent  family history. Social History   Tobacco Use   Smoking status: Never   Smokeless tobacco: Never  Vaping Use   Vaping Use: Never used  Substance Use Topics   Alcohol use: No   Drug use: No    No Known Allergies  Current Meds  Medication Sig   Multiple Vitamin (MULTIVITAMIN WITH MINERALS) TABS tablet Take 1 tablet by mouth daily.   rosuvastatin (CRESTOR) 20 MG tablet Take by mouth.   tamsulosin (FLOMAX) 0.4 MG CAPS capsule Take by mouth.   triamcinolone cream (KENALOG) 0.5 % Apply 1 application  topically 3 (three) times daily.   zolpidem (AMBIEN) 5 MG tablet Take by mouth.    Objective: BP 124/79   Pulse 82   Ht 5\' 10"  (1.778 m)   Wt 163 lb (73.9 kg)   BMI 23.39 kg/m   Physical Exam:  General: Alert and oriented. and No acute distress. Gait: Normal gait.  Left shoulder without deformity.  No atrophy is appreciated.  Tenderness to palpation of the posterior aspect of the shoulder.  He has full range of motion of the left shoulder, compared to the right.  4/5 strength in supraspinatus and infraspinatus strength testing.  Negative speeds.  Negative O'Brien's.  Negative belly press.  Fingers warm and well-perfused.  IMAGING: I personally ordered and reviewed the following images  X-rays of the left shoulder were  obtained in clinic today.  No acute injuries noted.  Well-maintained glenohumeral joint space.  No evidence of proximal humeral migration.  Good overall bone quality.  Impression: Negative left shoulder x-ray  New Medications:  No orders of the defined types were placed in this encounter.     Oliver Barre, MD  07/08/2021 1:31 PM

## 2022-01-16 ENCOUNTER — Encounter (HOSPITAL_COMMUNITY): Payer: Self-pay | Admitting: Emergency Medicine

## 2022-01-16 ENCOUNTER — Emergency Department (HOSPITAL_COMMUNITY)
Admission: EM | Admit: 2022-01-16 | Discharge: 2022-01-16 | Disposition: A | Discharge status: Home / Self Care | Payer: Medicare Other | Attending: Emergency Medicine | Admitting: Emergency Medicine

## 2022-01-16 ENCOUNTER — Emergency Department (HOSPITAL_COMMUNITY): Payer: Medicare Other

## 2022-01-16 ENCOUNTER — Other Ambulatory Visit: Payer: Self-pay

## 2022-01-16 DIAGNOSIS — R1110 Vomiting, unspecified: Secondary | ICD-10-CM

## 2022-01-16 DIAGNOSIS — R112 Nausea with vomiting, unspecified: Secondary | ICD-10-CM | POA: Insufficient documentation

## 2022-01-16 DIAGNOSIS — R197 Diarrhea, unspecified: Secondary | ICD-10-CM | POA: Diagnosis not present

## 2022-01-16 DIAGNOSIS — R944 Abnormal results of kidney function studies: Secondary | ICD-10-CM | POA: Diagnosis not present

## 2022-01-16 DIAGNOSIS — E860 Dehydration: Secondary | ICD-10-CM | POA: Insufficient documentation

## 2022-01-16 LAB — URINALYSIS, ROUTINE W REFLEX MICROSCOPIC
Bacteria, UA: NONE SEEN
Bilirubin Urine: NEGATIVE
Glucose, UA: NEGATIVE mg/dL
Hgb urine dipstick: NEGATIVE
Ketones, ur: 5 mg/dL — AB
Leukocytes,Ua: NEGATIVE
Nitrite: NEGATIVE
Protein, ur: 100 mg/dL — AB
Specific Gravity, Urine: 1.025 (ref 1.005–1.030)
pH: 5 (ref 5.0–8.0)

## 2022-01-16 LAB — CBC
HCT: 50.6 % (ref 39.0–52.0)
Hemoglobin: 17.4 g/dL — ABNORMAL HIGH (ref 13.0–17.0)
MCH: 31.8 pg (ref 26.0–34.0)
MCHC: 34.4 g/dL (ref 30.0–36.0)
MCV: 92.3 fL (ref 80.0–100.0)
Platelets: 167 10*3/uL (ref 150–400)
RBC: 5.48 MIL/uL (ref 4.22–5.81)
RDW: 12.2 % (ref 11.5–15.5)
WBC: 4.1 10*3/uL (ref 4.0–10.5)
nRBC: 0 % (ref 0.0–0.2)

## 2022-01-16 LAB — COMPREHENSIVE METABOLIC PANEL
ALT: 34 U/L (ref 0–44)
AST: 47 U/L — ABNORMAL HIGH (ref 15–41)
Albumin: 4.4 g/dL (ref 3.5–5.0)
Alkaline Phosphatase: 73 U/L (ref 38–126)
Anion gap: 8 (ref 5–15)
BUN: 26 mg/dL — ABNORMAL HIGH (ref 8–23)
CO2: 25 mmol/L (ref 22–32)
Calcium: 9 mg/dL (ref 8.9–10.3)
Chloride: 103 mmol/L (ref 98–111)
Creatinine, Ser: 1.41 mg/dL — ABNORMAL HIGH (ref 0.61–1.24)
GFR, Estimated: 55 mL/min — ABNORMAL LOW (ref >60)
Glucose, Bld: 98 mg/dL (ref 70–99)
Potassium: 3.5 mmol/L (ref 3.5–5.1)
Sodium: 136 mmol/L (ref 135–145)
Total Bilirubin: 0.8 mg/dL (ref 0.3–1.2)
Total Protein: 7.3 g/dL (ref 6.5–8.1)

## 2022-01-16 LAB — C DIFFICILE QUICK SCREEN W PCR REFLEX
C Diff antigen: NEGATIVE
C Diff interpretation: NOT DETECTED
C Diff toxin: NEGATIVE

## 2022-01-16 LAB — LIPASE, BLOOD: Lipase: 32 U/L (ref 11–51)

## 2022-01-16 MED ORDER — IOHEXOL 300 MG/ML  SOLN
100.0000 mL | Freq: Once | INTRAMUSCULAR | Status: AC | PRN
  Administered 2022-01-16: 85 mL via INTRAVENOUS

## 2022-01-16 MED ORDER — SODIUM CHLORIDE 0.9 % IV BOLUS
1000.0000 mL | Freq: Once | INTRAVENOUS | Status: AC
  Administered 2022-01-16: 1000 mL via INTRAVENOUS

## 2022-01-16 MED ORDER — AZITHROMYCIN 250 MG PO TABS: 500.0000 mg | ORAL_TABLET | Freq: Every day | ORAL | 0 refills | Status: AC

## 2022-01-16 MED ORDER — AZITHROMYCIN 250 MG PO TABS
500.0000 mg | ORAL_TABLET | Freq: Once | ORAL | Status: AC
  Administered 2022-01-16: 500 mg via ORAL
  Filled 2022-01-16: qty 2

## 2022-01-16 MED ORDER — DICYCLOMINE HCL 20 MG PO TABS: 20.0000 mg | ORAL_TABLET | Freq: Two times a day (BID) | ORAL | 0 refills | Status: AC

## 2022-01-16 MED ORDER — DICYCLOMINE HCL 10 MG/ML IM SOLN
20.0000 mg | Freq: Once | INTRAMUSCULAR | Status: AC
  Administered 2022-01-16: 20 mg via INTRAMUSCULAR
  Filled 2022-01-16: qty 2

## 2022-01-16 MED ORDER — ONDANSETRON HCL 4 MG PO TABS: 4.0000 mg | ORAL_TABLET | Freq: Four times a day (QID) | ORAL | 0 refills | Status: AC

## 2022-01-16 NOTE — Discharge Instructions (Addendum)
You were seen today for dehydration and severe diarrhea.  Your stool culture was pending.  Drink plenty of fluids.  Take the antibiotics you are prescribed in case this is a bacterial cause of diarrhea.  Follow-up with primary care to recheck your kidney function and come back to the ER if you have any new or worsening symptoms. [facility] Primary Care Doctor List  Syliva Overman, MD. Specialty: [facility] Medicine Contact information: [address]  [place] [address]  [phone]   Lilyan Punt, MD. Specialty: [facility] Medicine Contact information: [address]  [place] [address]  [phone]   Avon Gully, MD Specialty: Internal Medicine Contact information: [address] [place] [address]  [phone]   Catalina Pizza, MD. Specialty: Internal Medicine Contact information: [address]  [place] [address]  [phone]    [facility] (Dr. Selena Batten) Specialty: Family Medicine Contact information: [address]  [place] [address]  [phone]   John Giovanni, MD. Specialty: [facility] Medicine Contact information: [address]  [address]  [place] [address]  [phone]   Carylon Perches, MD. Specialty: Internal Medicine Contact information: [address]  [address]  [place] [address]  [phone]    [facility]  [address] [place], [place] [address] [phone]  Services [facility] offers a variety of basic health services.  Services include but are not limited to: Blood pressure checks  Heart rate checks  Blood sugar checks  Urine analysis  Rapid strep tests  Pregnancy tests.  Health education and referrals  People needing more complex services will be directed to a physician online. Using these virtual visits, doctors can evaluate and prescribe medicine and treatments. There will be no medication on-site, though  [facility] will help patients fill their prescriptions at little to no cost.   For More information please go to: [email or web address]

## 2022-01-16 NOTE — ED Provider Notes (Signed)
[facility] EMERGENCY DEPARTMENT Provider Note   CSN: [id number] Arrival date & time: 01/16/22  0957     History  Chief Complaint  Patient presents with   Emesis    Dustin Watson is a 66 y.o. male.  This 66 year old male with history of lipidemia who presents to ER complaining of diarrhea.  He states he started having nausea vomiting and diarrhea 2 days ago.  He does report eating some pork that it for started but nobody else who ate the food got sick, he has no sick contacts.  He states he went to urgent care yesterday and was given Phenergan suppositories for the nausea, he states he has been able to keep fluids down since then but states every time he drinks anything he immediately has watery diarrhea.  He states that the diarrhea started out as brown but is now just clear.  Has recent antibiotics, no abdominal tenderness but states he does have a lot of cramping.   Emesis      Home Medications Prior to Admission medications   Medication Sig Start Date End Date Taking? Authorizing Provider  azithromycin (ZITHROMAX) 250 MG tablet Take 2 tablets (500 mg total) by mouth daily for 4 days. Take first 2 tablets together, then 1 every day until finished. 01/16/22 01/20/22 Yes Kalia Vahey A, PA-C  dicyclomine (BENTYL) 20 MG tablet Take 1 tablet (20 mg total) by mouth 2 (two) times daily. 01/16/22  Yes Andrea Colglazier A, PA-C  ondansetron (ZOFRAN) 4 MG tablet Take 1 tablet (4 mg total) by mouth every 6 (six) hours. 01/16/22  Yes Glenice Ciccone A, PA-C  Multiple Vitamin (MULTIVITAMIN WITH MINERALS) TABS tablet Take 1 tablet by mouth daily.    [provider]  rosuvastatin (CRESTOR) 20 MG tablet Take by mouth. 02/16/21   [provider]  tamsulosin (FLOMAX) 0.4 MG CAPS capsule Take by mouth. 12/03/20   [provider]  triamcinolone cream (KENALOG) 0.5 % Apply 1 application  topically 3 (three) times daily.    [provider]  zolpidem (AMBIEN) 5 MG  tablet Take by mouth. 06/18/21 06/18/22  [provider]      Allergies    Patient has no known allergies.    Review of Systems   Review of Systems  Gastrointestinal:  Positive for vomiting.    Physical Exam Updated Vital Signs BP 132/80   Pulse 70   Temp 97.9 F (36.6 C) (Oral)   Resp 17   SpO2 100%  Physical Exam  ED Results / Procedures / Treatments   Labs (all labs ordered are listed, but only abnormal results are displayed) Labs Reviewed  COMPREHENSIVE METABOLIC PANEL - Abnormal; Notable for the following components:      Result Value   BUN 26 (*)    Creatinine, Ser 1.41 (*)    AST 47 (*)    GFR, Estimated 55 (*)    All other components within normal limits  CBC - Abnormal; Notable for the following components:   Hemoglobin 17.4 (*)    All other components within normal limits  URINALYSIS, ROUTINE W REFLEX MICROSCOPIC - Abnormal; Notable for the following components:   APPearance HAZY (*)    Ketones, ur 5 (*)    Protein, ur 100 (*)    All other components within normal limits  C DIFFICILE QUICK SCREEN W PCR REFLEX    GASTROINTESTINAL PANEL BY PCR, STOOL (REPLACES STOOL CULTURE)  LIPASE, BLOOD    EKG None  Radiology CT  ABDOMEN PELVIS W CONTRAST  Result Date: 01/16/2022 CLINICAL DATA:  Acute generalized abdominal pain.  Nausea, vomiting. EXAM: CT ABDOMEN AND PELVIS WITH CONTRAST TECHNIQUE: Multidetector CT imaging of the abdomen and pelvis was performed using the standard protocol following bolus administration of intravenous contrast. RADIATION DOSE REDUCTION: This exam was performed according to the departmental dose-optimization program which includes automated exposure control, adjustment of the mA and/or kV according to patient size and/or use of iterative reconstruction technique. CONTRAST:  85mL OMNIPAQUE IOHEXOL 300 MG/ML  SOLN COMPARISON:  None Available. FINDINGS: Lower chest: No acute abnormality. Hepatobiliary: No focal liver abnormality is  seen. No gallstones, gallbladder wall thickening, or biliary dilatation. Pancreas: Unremarkable. No pancreatic ductal dilatation or surrounding inflammatory changes. Spleen: Normal in size without focal abnormality. Adrenals/Urinary Tract: Adrenal glands appear normal. Bilateral parapelvic cysts are noted. No hydronephrosis or renal obstruction is noted. Urinary bladder is normal. Stomach/Bowel: Stomach is within normal limits. Appendix appears normal. No evidence of bowel wall thickening, distention, or inflammatory changes. Vascular/Lymphatic: No significant vascular findings are present. No enlarged abdominal or pelvic lymph nodes. Reproductive: Mild prostatic enlargement. Other: No abdominal wall hernia or abnormality. No abdominopelvic ascites. Musculoskeletal: No acute or significant osseous findings. IMPRESSION: No acute abnormality seen in the abdomen or pelvis. Electronically Signed   By: Lupita Raider M.D.   On: 01/16/2022 13:46    Procedures Procedures    Medications Ordered in ED Medications  azithromycin (ZITHROMAX) tablet 500 mg (has no administration in time range)  sodium chloride 0.9 % bolus 1,000 mL (0 mLs Intravenous Stopped 01/16/22 1153)  dicyclomine (BENTYL) injection 20 mg (20 mg Intramuscular Given 01/16/22 1149)  sodium chloride 0.9 % bolus 1,000 mL (0 mLs Intravenous Stopped 01/16/22 1350)  iohexol (OMNIPAQUE) 300 MG/ML solution 100 mL (85 mLs Intravenous Contrast Given 01/16/22 1315)    ED Course/ Medical Decision Making/ A&P                           Medical Decision Making This patient presents to the ED for concern of nausea vomiting and diarrhea, this involves an extensive number of treatment options, and is a complaint that carries with it a high risk of complications and morbidity.  The differential diagnosis includes colitis, diverticulitis, gastroenteritis, dehydration, AKI, electrolyte abnormality, other    Additional history obtained:  Additional  history obtained from EMR External records from outside source obtained and reviewed including outpatient visit for arthritis   Lab Tests:  I Ordered, and personally interpreted labs.  The pertinent results include: BC and CMP.  These show slightly elevated BUN and creatinine and bili elevated hemoglobin from patient's baseline, likely reflecting dehydration/hemoconcentration.   Imaging Studies ordered:  I ordered imaging studies including CT of the abdomen pelvis I independently visualized and interpreted imaging which showed no acute abnormalities I agree with the radiologist interpretation   Cardiac Monitoring: / EKG:  The patient was maintained on a cardiac monitor.  I personally viewed and interpreted the cardiac monitored which showed an underlying rhythm of: Sinus rhythm      Problem List / ED Course / Critical interventions / Medication management  Vomiting with diarrhea and subsequent dehydration-patient is having severe diarrhea for several days.  Initially was having vomiting but this now resolved.  Patient has been having contact with his father-in-law who has diarrhea, is in the process of waiting for stool culture himself, also around a lot of dogs at [employer].  He  does have several sources for possible bacterial illness.  Labs were consistent with dehydration.  Patient got 2 L of IV fluids and is feeling much better, Bentyl helped his stomach cramping.  CT was normal.  Empirically given azithromycin for his diarrhea pending his studies.  Advised on outpatient follow-up and strict return precautions.  Vitals are reassuring, patient's nontoxic in appearance. I ordered medication including normal saline for hydration, Bentyl for stomach cramping Reevaluation of the patient after these medicines showed that the patient improved I have reviewed the patients home medicines and have made adjustments as needed      Amount and/or Complexity of Data Reviewed Labs:  ordered.           Final Clinical Impression(s) / ED Diagnoses Final diagnoses:  Dehydration  Vomiting and diarrhea    Rx / DC Orders ED Discharge Orders          Ordered    ondansetron (ZOFRAN) 4 MG tablet  Every 6 hours        01/16/22 1416    azithromycin (ZITHROMAX) 250 MG tablet  Daily        01/16/22 1416    dicyclomine (BENTYL) 20 MG tablet  2 times daily        01/16/22 [address] 01/16/22 1427    Eber Hong, MD 01/19/22 [phone]

## 2022-01-16 NOTE — ED Triage Notes (Signed)
Pt c/o n/v/d started 2 days ago. Seen at [facility] yesterday and was given supp for n/v and hasn't vomited since. Pt states but diarrhea it constant. Mm moist. States is now holding things down just several episodes of diarrhea.  Ambulatory. Pt states lost 7 lbs in 2 days. No gen weakness noted. Pt feels dizzy with movement.

## 2022-01-17 ENCOUNTER — Telehealth (HOSPITAL_COMMUNITY): Payer: Self-pay | Admitting: Emergency Medicine

## 2022-01-17 LAB — GASTROINTESTINAL PANEL BY PCR, STOOL (REPLACES STOOL CULTURE)
Adenovirus F40/41: NOT DETECTED
Astrovirus: NOT DETECTED
Campylobacter species: NOT DETECTED
Cryptosporidium: NOT DETECTED
Cyclospora cayetanensis: NOT DETECTED
Entamoeba histolytica: NOT DETECTED
Enteroaggregative E coli (EAEC): NOT DETECTED
Enteropathogenic E coli (EPEC): DETECTED — AB
Enterotoxigenic E coli (ETEC): NOT DETECTED
Giardia lamblia: NOT DETECTED
Norovirus GI/GII: NOT DETECTED
Plesimonas shigelloides: NOT DETECTED
Rotavirus A: DETECTED — AB
Salmonella species: NOT DETECTED
Sapovirus (I, II, IV, and V): NOT DETECTED
Shiga like toxin producing E coli (STEC): NOT DETECTED
Shigella/Enteroinvasive E coli (EIEC): NOT DETECTED
Vibrio cholerae: NOT DETECTED
Vibrio species: NOT DETECTED
Yersinia enterocolitica: NOT DETECTED

## 2022-01-17 MED ORDER — SULFAMETHOXAZOLE-TRIMETHOPRIM 800-160 MG PO TABS: 1.0000 | ORAL_TABLET | Freq: Two times a day (BID) | ORAL | 0 refills | Status: AC

## 2022-01-17 NOTE — Telephone Encounter (Signed)
Patient had PCR results positive for Rotavirus and EPEC. TMP/Sulfa sent to pharmacy for EPEC. Patient called and notified of findings. Has continued diarrhea at this time but is staying hydrated with Gatorade and Pedialyte.   Franne Forts 01/17/22 5:06 PM

## 2022-03-26 ENCOUNTER — Encounter: Payer: Self-pay | Admitting: Radiology
